# Patient Record
Sex: Male | Born: 1940 | Race: White | Hispanic: No | Marital: Married | Smoking: Former smoker
Health system: Southern US, Community
[De-identification: ages and names within clinical notes are randomized; demographics above are authoritative.]

## PROBLEM LIST (undated history)

## (undated) DIAGNOSIS — C679 Malignant neoplasm of bladder, unspecified: Secondary | ICD-10-CM

## (undated) DIAGNOSIS — E7800 Pure hypercholesterolemia, unspecified: Secondary | ICD-10-CM

## (undated) DIAGNOSIS — I10 Essential (primary) hypertension: Secondary | ICD-10-CM

## (undated) DIAGNOSIS — A4902 Methicillin resistant Staphylococcus aureus infection, unspecified site: Secondary | ICD-10-CM

## (undated) HISTORY — PX: BLADDER SURGERY: SHX569

## (undated) HISTORY — PX: BACK SURGERY: SHX140

---

## 2016-09-15 ENCOUNTER — Emergency Department (HOSPITAL_BASED_OUTPATIENT_CLINIC_OR_DEPARTMENT_OTHER)
Admission: EM | Admit: 2016-09-15 | Discharge: 2016-09-15 | Disposition: A | Discharge status: Home / Self Care | Payer: Medicare Other | Attending: Emergency Medicine | Admitting: Emergency Medicine

## 2016-09-15 ENCOUNTER — Encounter (HOSPITAL_BASED_OUTPATIENT_CLINIC_OR_DEPARTMENT_OTHER): Payer: Self-pay | Admitting: Emergency Medicine

## 2016-09-15 ENCOUNTER — Emergency Department (HOSPITAL_BASED_OUTPATIENT_CLINIC_OR_DEPARTMENT_OTHER): Payer: Medicare Other

## 2016-09-15 DIAGNOSIS — Z8551 Personal history of malignant neoplasm of bladder: Secondary | ICD-10-CM | POA: Diagnosis not present

## 2016-09-15 DIAGNOSIS — N1330 Unspecified hydronephrosis: Secondary | ICD-10-CM | POA: Diagnosis not present

## 2016-09-15 DIAGNOSIS — I10 Essential (primary) hypertension: Secondary | ICD-10-CM | POA: Insufficient documentation

## 2016-09-15 DIAGNOSIS — R1032 Left lower quadrant pain: Secondary | ICD-10-CM | POA: Diagnosis present

## 2016-09-15 HISTORY — DX: Pure hypercholesterolemia, unspecified: E78.00

## 2016-09-15 HISTORY — DX: Essential (primary) hypertension: I10

## 2016-09-15 HISTORY — DX: Malignant neoplasm of bladder, unspecified: C67.9

## 2016-09-15 LAB — CBC WITH DIFFERENTIAL/PLATELET
BASOS ABS: 0 10*3/uL (ref 0.0–0.1)
BASOS PCT: 0 %
Eosinophils Absolute: 0 10*3/uL (ref 0.0–0.7)
Eosinophils Relative: 0 %
HCT: 42.3 % (ref 39.0–52.0)
Hemoglobin: 14.4 g/dL (ref 13.0–17.0)
Lymphocytes Relative: 9 %
Lymphs Abs: 1 10*3/uL (ref 0.7–4.0)
MCH: 31.2 pg (ref 26.0–34.0)
MCHC: 34 g/dL (ref 30.0–36.0)
MCV: 91.6 fL (ref 78.0–100.0)
MONO ABS: 0.8 10*3/uL (ref 0.1–1.0)
Monocytes Relative: 8 %
Neutro Abs: 9.3 10*3/uL — ABNORMAL HIGH (ref 1.7–7.7)
Neutrophils Relative %: 83 %
PLATELETS: 200 10*3/uL (ref 150–400)
RBC: 4.62 MIL/uL (ref 4.22–5.81)
RDW: 12.6 % (ref 11.5–15.5)
WBC: 11.2 10*3/uL — ABNORMAL HIGH (ref 4.0–10.5)

## 2016-09-15 LAB — URINALYSIS, ROUTINE W REFLEX MICROSCOPIC
Bilirubin Urine: NEGATIVE
GLUCOSE, UA: NEGATIVE mg/dL
Ketones, ur: NEGATIVE mg/dL
Nitrite: NEGATIVE
PROTEIN: 100 mg/dL — AB
Specific Gravity, Urine: 1.015 (ref 1.005–1.030)
pH: 6 (ref 5.0–8.0)

## 2016-09-15 LAB — BASIC METABOLIC PANEL
Anion gap: 8 (ref 5–15)
BUN: 36 mg/dL — AB (ref 6–20)
CO2: 22 mmol/L (ref 22–32)
Calcium: 9.8 mg/dL (ref 8.9–10.3)
Chloride: 110 mmol/L (ref 101–111)
Creatinine, Ser: 1.97 mg/dL — ABNORMAL HIGH (ref 0.61–1.24)
GFR calc Af Amer: 36 mL/min — ABNORMAL LOW (ref >60)
GFR, EST NON AFRICAN AMERICAN: 31 mL/min — AB (ref >60)
GLUCOSE: 141 mg/dL — AB (ref 65–99)
Potassium: 4.2 mmol/L (ref 3.5–5.1)
Sodium: 140 mmol/L (ref 135–145)

## 2016-09-15 LAB — URINALYSIS, MICROSCOPIC (REFLEX)

## 2016-09-15 MED ORDER — CEPHALEXIN 500 MG PO CAPS: 500.0000 mg | ORAL_CAPSULE | Freq: Four times a day (QID) | ORAL | 0 refills | Status: DC

## 2016-09-15 MED ORDER — HYDROCODONE-ACETAMINOPHEN 5-325 MG PO TABS: 1.0000 | ORAL_TABLET | Freq: Four times a day (QID) | ORAL | 0 refills | Status: AC | PRN

## 2016-09-15 MED ORDER — ONDANSETRON HCL 4 MG/2ML IJ SOLN
4.0000 mg | Freq: Once | INTRAMUSCULAR | Status: AC
  Administered 2016-09-15: 4 mg via INTRAVENOUS

## 2016-09-15 MED ORDER — ONDANSETRON HCL 4 MG/2ML IJ SOLN
INTRAMUSCULAR | Status: AC
  Filled 2016-09-15: qty 2

## 2016-09-15 MED ORDER — KETOROLAC TROMETHAMINE 30 MG/ML IJ SOLN
15.0000 mg | Freq: Once | INTRAMUSCULAR | Status: AC
  Administered 2016-09-15: 15 mg via INTRAVENOUS
  Filled 2016-09-15: qty 1

## 2016-09-15 NOTE — ED Notes (Signed)
Patient transported to CT 

## 2016-09-15 NOTE — ED Notes (Signed)
ED Provider at bedside. 

## 2016-09-15 NOTE — ED Provider Notes (Signed)
[facility] DEPT [facility] Provider Note   CSN: [id number] Arrival date & time: 09/15/16  7169     History   Chief Complaint Chief Complaint  Patient presents with  . Back Pain    HPI Jerry Hunt is a 76 y.o. male.  Patient is a 76 year old male with history of hypertension, hypercholesterolemia, bladder cancer treated 20 years ago. He presents today for evaluation of left flank pain. This started approximately 3 AM and woke him from sleep. He denies any urinary or bowel complaints. He denies any radiation of his pain into his legs.   The history is provided by the patient.  Back Pain   This is a new problem. The current episode started 3 to 5 hours ago. The problem occurs constantly. The problem has not changed since onset.The pain is associated with no known injury. Pain location: Left flank. The quality of the pain is described as stabbing. The pain does not radiate. The pain is moderate. The symptoms are aggravated by bending, twisting and certain positions.    Past Medical History:  Diagnosis Date  . Bladder cancer ([place])   . High cholesterol   . Hypertension     There are no active problems to display for this patient.   Past Surgical History:  Procedure Laterality Date  . BACK SURGERY    . BLADDER SURGERY         Home Medications    Prior to Admission medications   Not on File    Family History No family history on file.  Social History Social History  Substance Use Topics  . Smoking status: Never Smoker  . Smokeless tobacco: Never Used  . Alcohol use No     Allergies   Patient has no known allergies.   Review of Systems Review of Systems  Musculoskeletal: Positive for back pain.  All other systems reviewed and are negative.    Physical Exam Updated Vital Signs BP (!) 171/92 (BP Location: Left Arm)   Pulse 89   Temp 98.3 F (36.8 C) (Oral)   Resp 18   Ht 5\' 9"  (1.753 m)   Wt 68 kg (150 lb)   SpO2 97%   BMI 22.15 kg/m    Physical Exam  Constitutional: He is oriented to person, place, and time. He appears well-developed and well-nourished. No distress.  HENT:  Head: Normocephalic and atraumatic.  Mouth/Throat: Oropharynx is clear and moist.  Neck: Normal range of motion. Neck supple.  Cardiovascular: Normal rate and regular rhythm.  Exam reveals no friction rub.   No murmur heard. Pulmonary/Chest: Effort normal and breath sounds normal. No respiratory distress. He has no wheezes. He has no rales.  Abdominal: Soft. Bowel sounds are normal. He exhibits no distension. There is no tenderness.  There is mild left-sided CVA tenderness.  Musculoskeletal: Normal range of motion. He exhibits no edema.  Neurological: He is alert and oriented to person, place, and time. Coordination normal.  Skin: Skin is warm and dry. He is not diaphoretic.  Nursing note and vitals reviewed.    ED Treatments / Results  Labs (all labs ordered are listed, but only abnormal results are displayed) Labs Reviewed  URINALYSIS, ROUTINE W REFLEX MICROSCOPIC  BASIC METABOLIC PANEL  CBC WITH DIFFERENTIAL/PLATELET    EKG  EKG Interpretation None       Radiology No results found.  Procedures Procedures (including critical care time)  Medications Ordered in ED Medications  ketorolac (TORADOL) 30 MG/ML injection 15 mg (not administered)  Initial Impression / Assessment and Plan / ED Course  I have reviewed the triage vital signs and the nursing notes.  Pertinent labs & imaging results that were available during my care of the patient were reviewed by me and considered in my medical decision making (see chart for details).  Patient presents with acute onset left flank pain. His presentation was consistent with a renal calculus area he was initially quite uncomfortable and was given a small dose of Toradol which did seem to help.  Workup reveals left-sided hydronephrosis that appears to be related to a ureteropelvic  junction narrowing. There is no evidence for renal calculus. His urine shows too numerous to count red cells and too numerous to count white cells, however no nitrates in his urine. His white count is 11,000 and there is no fever.  I've discussed this with Dr. McDiarmid from urology. It is his recommendation for the patient be treated with pain medicine and follow-up in the urology clinic in the next 2 weeks.  Final Clinical Impressions(s) / ED Diagnoses   Final diagnoses:  None    New Prescriptions New Prescriptions   No medications on file     Veryl Speak, MD 09/15/16 [phone]

## 2016-09-15 NOTE — ED Triage Notes (Signed)
Pt reports left sided lower back pain that awoke him from sleep. Pt report vomiting x 2 episodes. Pt denies any urinary symptoms.

## 2016-09-15 NOTE — ED Notes (Signed)
Patient denies pain and is resting comfortably.  

## 2016-09-15 NOTE — Discharge Instructions (Signed)
Keflex as prescribed.  Hydrocodone as prescribed as needed for pain.  Follow-up with urology in the next 2 weeks. The contact information for [facility] has been provided in this discharge summary for you to call and make these arrangements. Dr. McDiarmid is aware of your situation and once you seen in this timeframe.  Return to the emergency department if you develop worsening pain, high fevers, or other new and concerning symptoms.

## 2016-09-17 LAB — URINE CULTURE

## 2016-09-18 ENCOUNTER — Telehealth: Payer: Self-pay

## 2016-09-18 NOTE — Telephone Encounter (Signed)
UC faxed to Dr Venia Minks office [phone]

## 2019-01-24 ENCOUNTER — Other Ambulatory Visit: Payer: Self-pay

## 2019-01-24 DIAGNOSIS — Z20822 Contact with and (suspected) exposure to covid-19: Secondary | ICD-10-CM

## 2019-01-25 LAB — NOVEL CORONAVIRUS, NAA: SARS-CoV-2, NAA: NOT DETECTED

## 2019-02-04 ENCOUNTER — Telehealth: Payer: Self-pay | Admitting: Internal Medicine

## 2019-02-04 NOTE — Telephone Encounter (Signed)
Pt aware covid lab test negative, not detected °

## 2019-05-19 ENCOUNTER — Encounter (HOSPITAL_BASED_OUTPATIENT_CLINIC_OR_DEPARTMENT_OTHER): Payer: Self-pay | Admitting: Emergency Medicine

## 2019-05-19 ENCOUNTER — Other Ambulatory Visit: Payer: Self-pay

## 2019-05-19 ENCOUNTER — Emergency Department (HOSPITAL_BASED_OUTPATIENT_CLINIC_OR_DEPARTMENT_OTHER)
Admission: EM | Admit: 2019-05-19 | Discharge: 2019-05-19 | Disposition: A | Discharge status: Home / Self Care | Payer: Medicare Other | Attending: Emergency Medicine | Admitting: Emergency Medicine

## 2019-05-19 DIAGNOSIS — Z888 Allergy status to other drugs, medicaments and biological substances status: Secondary | ICD-10-CM | POA: Insufficient documentation

## 2019-05-19 DIAGNOSIS — Z79899 Other long term (current) drug therapy: Secondary | ICD-10-CM | POA: Insufficient documentation

## 2019-05-19 DIAGNOSIS — E782 Mixed hyperlipidemia: Secondary | ICD-10-CM | POA: Insufficient documentation

## 2019-05-19 DIAGNOSIS — Z91040 Latex allergy status: Secondary | ICD-10-CM | POA: Insufficient documentation

## 2019-05-19 DIAGNOSIS — Z881 Allergy status to other antibiotic agents status: Secondary | ICD-10-CM | POA: Diagnosis not present

## 2019-05-19 DIAGNOSIS — M791 Myalgia, unspecified site: Secondary | ICD-10-CM

## 2019-05-19 DIAGNOSIS — Z20822 Contact with and (suspected) exposure to covid-19: Secondary | ICD-10-CM | POA: Diagnosis not present

## 2019-05-19 DIAGNOSIS — M7918 Myalgia, other site: Secondary | ICD-10-CM | POA: Diagnosis present

## 2019-05-19 DIAGNOSIS — Z8551 Personal history of malignant neoplasm of bladder: Secondary | ICD-10-CM | POA: Insufficient documentation

## 2019-05-19 DIAGNOSIS — R6883 Chills (without fever): Secondary | ICD-10-CM | POA: Insufficient documentation

## 2019-05-19 DIAGNOSIS — I10 Essential (primary) hypertension: Secondary | ICD-10-CM | POA: Diagnosis not present

## 2019-05-19 DIAGNOSIS — Z886 Allergy status to analgesic agent status: Secondary | ICD-10-CM | POA: Insufficient documentation

## 2019-05-19 HISTORY — DX: Methicillin resistant Staphylococcus aureus infection, unspecified site: A49.02

## 2019-05-19 LAB — RAPID INFLUENZA A&B ANTIGENS
Influenza A (ARMC): NEGATIVE
Influenza B (ARMC): NEGATIVE

## 2019-05-19 NOTE — ED Triage Notes (Addendum)
Woke up with chills, aching and cough this am. Took 2 Tylenol at 1600 today

## 2019-05-19 NOTE — Discharge Instructions (Signed)
Please alternate 650 mg of Tylenol and 600 mg of ibuprofen every 4 hours as needed for any fever over 100.4.  If you should develop severe or worsening symptoms please return to the emergency department otherwise follow-up with your doctor in 1 week  If your test is positive for Covid or the flu you will be contacted.  Please quarantine until you get your results

## 2019-05-19 NOTE — ED Provider Notes (Signed)
[name] EMERGENCY DEPARTMENT Provider Note   CSN: [id number] Arrival date & time: 05/19/19  1654     History Chief Complaint  Patient presents with  . COVID symtoms    Jerry Hunt is a 79 y.o. male.  HPI   This patient is an 79 year old male, he has no significant prior chronic medical conditions however this morning he woke up with chills and body aches, has not measured a fever but has had ongoing chills and aches throughout the day.  He denies any other sources of infection, no cough or shortness of breath, no chest pain abdominal pain nausea vomiting or diarrhea.  No sore throat though he does have a mild runny nose.  He denies any sick contacts, he works at a funeral home, he has already been tested 4 times this year for coronavirus 19 and has been negative each time.  He received his first dose of vaccination approximately 10 days ago and is due for his next dose in 10 days.  The patient has no other symptoms, no sick contacts at home  Past Medical History:  Diagnosis Date  . Bladder cancer ([place])   . High cholesterol   . Hypertension   . MRSA (methicillin resistant Staphylococcus aureus)     There are no problems to display for this patient.   Past Surgical History:  Procedure Laterality Date  . BACK SURGERY    . BLADDER SURGERY         No family history on file.  Social History   Tobacco Use  . Smoking status: Never Smoker  . Smokeless tobacco: Never Used  Substance Use Topics  . Alcohol use: No  . Drug use: Not on file    Home Medications Prior to Admission medications   Medication Sig Start Date End Date Taking? Authorizing Provider  amLODipine (NORVASC) 5 MG tablet Take by mouth. 07/03/18  Yes [provider]  doxepin (SINEQUAN) 10 MG capsule TAKE 1 CAPSULE(10 MG) BY MOUTH DAILY 12/02/16  Yes [provider]  Omega-3 Fatty Acids (FISH OIL) 1000 MG CAPS TAKE 2 CAPSULES BY MOUTH TWICE DAILY 12/02/16  Yes [provider]  primidone (MYSOLINE) 50 MG tablet TAKE 1 TABLET(50 MG) BY MOUTH TWICE DAILY 12/02/16  Yes [provider]  cephALEXin (KEFLEX) 500 MG capsule Take 1 capsule (500 mg total) by mouth 4 (four) times daily. 09/15/16   Veryl Speak, MD  Cholecalciferol 25 MCG (1000 UT) tablet Take by mouth.    [provider]  cholestyramine light (PREVALITE) 4 GM/DOSE powder Take 4 g by mouth 2 (two) times daily. 05/13/19   [provider]  HYDROcodone-acetaminophen (NORCO/VICODIN) 5-325 MG tablet Take 1-2 tablets by mouth every 6 (six) hours as needed. 09/15/16   Veryl Speak, MD  metoprolol succinate (TOPROL-XL) 50 MG 24 hr tablet Take 50 mg by mouth daily. 05/13/19   [provider]  vitamin E 180 MG (400 UNITS) capsule Take by mouth.    [provider]    Allergies    Ace inhibitors, Aspirin, Atorvastatin, Ezetimibe, Olmesartan medoxomil-hctz, Other, Telithromycin, Lactase, and Latex  Review of Systems   Review of Systems  All other systems reviewed and are negative.   Physical Exam Updated Vital Signs BP (!) 161/91 (BP Location: Left Arm)   Pulse 91   Temp 97.8 F (36.6 C) (Oral)   Resp 20   Ht 1.753 m (5\' 9" )   Wt (!) 147.8 kg   SpO2 94%  BMI 48.12 kg/m   Physical Exam Vitals and nursing note reviewed.  Constitutional:      General: He is not in acute distress.    Appearance: He is well-developed.  HENT:     Head: Normocephalic and atraumatic.     Mouth/Throat:     Pharynx: No oropharyngeal exudate.  Eyes:     General: No scleral icterus.       Right eye: No discharge.        Left eye: No discharge.     Conjunctiva/sclera: Conjunctivae normal.     Pupils: Pupils are equal, round, and reactive to light.  Neck:     Thyroid: No thyromegaly.     Vascular: No JVD.  Cardiovascular:     Rate and Rhythm: Normal rate and regular rhythm.     Heart sounds: Normal heart sounds. No murmur. No friction rub. No gallop.   Pulmonary:      Effort: Pulmonary effort is normal. No respiratory distress.     Breath sounds: Normal breath sounds. No wheezing or rales.  Abdominal:     General: Bowel sounds are normal. There is no distension.     Palpations: Abdomen is soft. There is no mass.     Tenderness: There is no abdominal tenderness.  Musculoskeletal:        General: No tenderness. Normal range of motion.     Cervical back: Normal range of motion and neck supple.  Lymphadenopathy:     Cervical: No cervical adenopathy.  Skin:    General: Skin is warm and dry.     Findings: No erythema or rash.  Neurological:     Mental Status: He is alert.     Coordination: Coordination normal.  Psychiatric:        Behavior: Behavior normal.     ED Results / Procedures / Treatments   Labs (all labs ordered are listed, but only abnormal results are displayed) Labs Reviewed  SARS CORONAVIRUS 2 (TAT 6-24 HRS)  RAPID INFLUENZA A&B ANTIGENS    EKG None  Radiology No results found.  Procedures Procedures (including critical care time)  Medications Ordered in ED Medications - No data to display  ED Course  I have reviewed the triage vital signs and the nursing notes.  Pertinent labs & imaging results that were available during my care of the patient were reviewed by me and considered in my medical decision making (see chart for details).    MDM Rules/Calculators/A&P                      This patient is very well-appearing, he has a normal heart rate, normal oxygenation, no fever, no tachycardia, no hypotension.  He has no focal symptoms other than aches in his muscles very diffusely, chills but has a normal gait, normal speech pattern, normal respiratory status and lung function and on my exam appears very well.  He can have outpatient testing for both Covid and influenza and I anticipate discharge.  He does not have any other sources to make me think that this is intra-abdominal or more severe infection that would require  further evaluation or inpatient treatment.  He is agreeable to the plan.   Final Clinical Impression(s) / ED Diagnoses Final diagnoses:  Chills  Myalgia    Rx / DC Orders ED Discharge Orders    None       Noemi Chapel, MD 05/19/19 1755

## 2019-05-20 LAB — SARS CORONAVIRUS 2 (TAT 6-24 HRS): SARS Coronavirus 2: NEGATIVE

## 2019-05-21 ENCOUNTER — Telehealth (HOSPITAL_COMMUNITY): Payer: Self-pay

## 2020-06-16 ENCOUNTER — Other Ambulatory Visit: Payer: Self-pay

## 2020-06-16 ENCOUNTER — Encounter (HOSPITAL_BASED_OUTPATIENT_CLINIC_OR_DEPARTMENT_OTHER): Payer: Self-pay | Admitting: Emergency Medicine

## 2020-06-16 ENCOUNTER — Emergency Department (HOSPITAL_BASED_OUTPATIENT_CLINIC_OR_DEPARTMENT_OTHER)
Admission: EM | Admit: 2020-06-16 | Discharge: 2020-06-16 | Disposition: A | Discharge status: Home / Self Care | Payer: Medicare Other | Attending: Emergency Medicine | Admitting: Emergency Medicine

## 2020-06-16 ENCOUNTER — Emergency Department (HOSPITAL_BASED_OUTPATIENT_CLINIC_OR_DEPARTMENT_OTHER): Payer: Medicare Other

## 2020-06-16 DIAGNOSIS — R0981 Nasal congestion: Secondary | ICD-10-CM | POA: Diagnosis present

## 2020-06-16 DIAGNOSIS — I10 Essential (primary) hypertension: Secondary | ICD-10-CM | POA: Insufficient documentation

## 2020-06-16 DIAGNOSIS — Z8551 Personal history of malignant neoplasm of bladder: Secondary | ICD-10-CM | POA: Diagnosis not present

## 2020-06-16 DIAGNOSIS — Z91040 Latex allergy status: Secondary | ICD-10-CM | POA: Insufficient documentation

## 2020-06-16 DIAGNOSIS — R911 Solitary pulmonary nodule: Secondary | ICD-10-CM | POA: Diagnosis not present

## 2020-06-16 DIAGNOSIS — N3001 Acute cystitis with hematuria: Secondary | ICD-10-CM | POA: Insufficient documentation

## 2020-06-16 DIAGNOSIS — Z20822 Contact with and (suspected) exposure to covid-19: Secondary | ICD-10-CM | POA: Insufficient documentation

## 2020-06-16 LAB — URINALYSIS, MICROSCOPIC (REFLEX): WBC, UA: >50 WBC/hpf (ref 0–5)

## 2020-06-16 LAB — COMPREHENSIVE METABOLIC PANEL
ALT: 15 U/L (ref 0–44)
AST: 16 U/L (ref 15–41)
Albumin: 3.1 g/dL — ABNORMAL LOW (ref 3.5–5.0)
Alkaline Phosphatase: 73 U/L (ref 38–126)
Anion gap: 9 (ref 5–15)
BUN: 30 mg/dL — ABNORMAL HIGH (ref 8–23)
CO2: 19 mmol/L — ABNORMAL LOW (ref 22–32)
Calcium: 8.9 mg/dL (ref 8.9–10.3)
Chloride: 104 mmol/L (ref 98–111)
Creatinine, Ser: 2.05 mg/dL — ABNORMAL HIGH (ref 0.61–1.24)
GFR, Estimated: 32 mL/min — ABNORMAL LOW (ref >60)
Glucose, Bld: 124 mg/dL — ABNORMAL HIGH (ref 70–99)
Potassium: 3.7 mmol/L (ref 3.5–5.1)
Sodium: 132 mmol/L — ABNORMAL LOW (ref 135–145)
Total Bilirubin: 0.3 mg/dL (ref 0.3–1.2)
Total Protein: 7.4 g/dL (ref 6.5–8.1)

## 2020-06-16 LAB — URINALYSIS, ROUTINE W REFLEX MICROSCOPIC
Bilirubin Urine: NEGATIVE
Glucose, UA: NEGATIVE mg/dL
Ketones, ur: NEGATIVE mg/dL
Nitrite: NEGATIVE
Protein, ur: 30 mg/dL — AB
Specific Gravity, Urine: 1.015 (ref 1.005–1.030)
pH: 6 (ref 5.0–8.0)

## 2020-06-16 LAB — CBC WITH DIFFERENTIAL/PLATELET
Abs Immature Granulocytes: 0.04 10*3/uL (ref 0.00–0.07)
Basophils Absolute: 0 10*3/uL (ref 0.0–0.1)
Basophils Relative: 0 %
Eosinophils Absolute: 0 10*3/uL (ref 0.0–0.5)
Eosinophils Relative: 0 %
HCT: 40.6 % (ref 39.0–52.0)
Hemoglobin: 13.9 g/dL (ref 13.0–17.0)
Immature Granulocytes: 0 %
Lymphocytes Relative: 10 %
Lymphs Abs: 1.2 10*3/uL (ref 0.7–4.0)
MCH: 31.4 pg (ref 26.0–34.0)
MCHC: 34.2 g/dL (ref 30.0–36.0)
MCV: 91.6 fL (ref 80.0–100.0)
Monocytes Absolute: 1.7 10*3/uL — ABNORMAL HIGH (ref 0.1–1.0)
Monocytes Relative: 14 %
Neutro Abs: 9.2 10*3/uL — ABNORMAL HIGH (ref 1.7–7.7)
Neutrophils Relative %: 76 %
Platelets: 166 10*3/uL (ref 150–400)
RBC: 4.43 MIL/uL (ref 4.22–5.81)
RDW: 13.2 % (ref 11.5–15.5)
WBC: 12.2 10*3/uL — ABNORMAL HIGH (ref 4.0–10.5)
nRBC: 0 % (ref 0.0–0.2)

## 2020-06-16 LAB — LACTIC ACID, PLASMA: Lactic Acid, Venous: 0.8 mmol/L (ref 0.5–1.9)

## 2020-06-16 MED ORDER — CEPHALEXIN 500 MG PO CAPS: 500.0000 mg | ORAL_CAPSULE | Freq: Four times a day (QID) | ORAL | 0 refills | Status: AC

## 2020-06-16 MED ORDER — SODIUM CHLORIDE 0.9 % IV BOLUS
500.0000 mL | Freq: Once | INTRAVENOUS | Status: AC
  Administered 2020-06-16: 500 mL via INTRAVENOUS

## 2020-06-16 MED ORDER — SODIUM CHLORIDE 0.9 % IV SOLN
1.0000 g | Freq: Once | INTRAVENOUS | Status: AC
  Administered 2020-06-16: 1 g via INTRAVENOUS
  Filled 2020-06-16: qty 10

## 2020-06-16 MED ORDER — ACETAMINOPHEN 500 MG PO TABS
1000.0000 mg | ORAL_TABLET | Freq: Once | ORAL | Status: AC
  Administered 2020-06-16: 1000 mg via ORAL
  Filled 2020-06-16: qty 2

## 2020-06-16 NOTE — ED Triage Notes (Signed)
C/o chills, body aches, and runny nose.  States my wife thinks I have covid.

## 2020-06-16 NOTE — Discharge Instructions (Addendum)
You were seen in the emerge department today with body aches and chills.  It appears as if you have a urine infection and I am starting on antibiotics.I am testing you for Covid as well and the test results will come through in the [facility] app on your phone.  Please follow closely with your primary care doctor and if you begin to feel worse despite antibiotics please return for reevaluation.

## 2020-06-16 NOTE — ED Provider Notes (Signed)
Emergency Department Provider Note   I have reviewed the triage vital signs and the nursing notes.   HISTORY  Chief Complaint covid symptoms   HPI Jerry Hunt is a 80 y.o. male with PMH reviewed below presents to the ED with body aches and nasal congestion with concern for COVID re-infection. He denies any SOB or CP. He developed the above symptoms and ultimately was convinced by his wife to come in for ED evaluation. No dysuria, hesitancy, or urgency. No abdominal pain. No back or flank pain. No radiation of symptoms or modifying factors.    Past Medical History:  Diagnosis Date  . Bladder cancer ([place])   . High cholesterol   . Hypertension   . MRSA (methicillin resistant Staphylococcus aureus)     There are no problems to display for this patient.   Past Surgical History:  Procedure Laterality Date  . BACK SURGERY    . BLADDER SURGERY      Allergies Ace inhibitors, Aspirin, Atorvastatin, Ezetimibe, Olmesartan medoxomil-hctz, Other, Telithromycin, Lactase, and Latex  No family history on file.  Social History Social History   Tobacco Use  . Smoking status: Never Smoker  . Smokeless tobacco: Never Used  Substance Use Topics  . Alcohol use: No    Review of Systems  Constitutional: Positive fever/chills Eyes: No visual changes. ENT: No sore throat. Cardiovascular: Denies chest pain. Respiratory: Denies shortness of breath. Gastrointestinal: No abdominal pain.  No nausea, no vomiting.  No diarrhea.  No constipation. Genitourinary: Negative for dysuria. Musculoskeletal: Negative for back pain. Skin: Negative for rash. Neurological: Negative for headaches, focal weakness or numbness.  10-point ROS otherwise negative.  ____________________________________________   PHYSICAL EXAM:  VITAL SIGNS: ED Triage Vitals  Enc Vitals Group     BP 06/16/20 1955 (!) 128/98     Pulse Rate 06/16/20 1955 (!) 125     Resp 06/16/20 1955 20     Temp 06/16/20 1955  98.6 F (37 C)     Temp Source 06/16/20 1955 Oral     SpO2 06/16/20 1955 94 %     Weight 06/16/20 1956 155 lb (70.3 kg)     Height 06/16/20 1956 5\' 9"  (1.753 m)   Constitutional: Alert and oriented. Well appearing and in no acute distress. Eyes: Conjunctivae are normal.  Head: Atraumatic. Nose: No congestion/rhinnorhea. Mouth/Throat: Mucous membranes are moist.  Oropharynx non-erythematous. Neck: No stridor.   Cardiovascular: Normal rate, regular rhythm. Good peripheral circulation. Grossly normal heart sounds.   Respiratory: Normal respiratory effort.  No retractions. Lungs CTAB. Gastrointestinal: Soft and nontender. No distention.  Musculoskeletal: No lower extremity tenderness nor edema. No gross deformities of extremities. Neurologic:  Normal speech and language. No gross focal neurologic deficits are appreciated.  Skin:  Skin is warm, dry and intact. No rash noted.  ____________________________________________   LABS (all labs ordered are listed, but only abnormal results are displayed)  Labs Reviewed  URINE CULTURE - Abnormal; Notable for the following components:      Result Value   Culture   (*)    Value: >=100,000 COLONIES/mL METHICILLIN RESISTANT STAPHYLOCOCCUS AUREUS   Organism ID, Bacteria METHICILLIN RESISTANT STAPHYLOCOCCUS AUREUS (*)    All other components within normal limits  COMPREHENSIVE METABOLIC PANEL - Abnormal; Notable for the following components:   Sodium 132 (*)    CO2 19 (*)    Glucose, Bld 124 (*)    BUN 30 (*)    Creatinine, Ser 2.05 (*)    Albumin 3.1 (*)  GFR, Estimated 32 (*)    All other components within normal limits  CBC WITH DIFFERENTIAL/PLATELET - Abnormal; Notable for the following components:   WBC 12.2 (*)    Neutro Abs 9.2 (*)    Monocytes Absolute 1.7 (*)    All other components within normal limits  URINALYSIS, ROUTINE W REFLEX MICROSCOPIC - Abnormal; Notable for the following components:   APPearance CLOUDY (*)    Hgb  urine dipstick LARGE (*)    Protein, ur 30 (*)    Leukocytes,Ua LARGE (*)    All other components within normal limits  URINALYSIS, MICROSCOPIC (REFLEX) - Abnormal; Notable for the following components:   Bacteria, UA MANY (*)    All other components within normal limits  CULTURE, BLOOD (ROUTINE X 2)  CULTURE, BLOOD (ROUTINE X 2)  SARS CORONAVIRUS 2 (TAT 6-24 HRS)  LACTIC ACID, PLASMA   ____________________________________________  EKG   EKG Interpretation  Date/Time:  Tuesday June 16 2020 20:25:20 EST Ventricular Rate:  111 PR Interval:    QRS Duration: 133 QT Interval:  336 QTC Calculation: 457 R Axis:   87 Text Interpretation: Sinus tachycardia Prominent P waves, nondiagnostic Right bundle branch block ST elevation, consider inferior injury similar to previous tracing on the same day Confirmed by Theotis Burrow [phone]) on 06/17/2020 2:55:33 PM       ____________________________________________  RADIOLOGY  No acute findings on CXR.  ____________________________________________   PROCEDURES  Procedure(s) performed:   Procedures  None ____________________________________________   INITIAL IMPRESSION / ASSESSMENT AND PLAN / ED COURSE  Pertinent labs & imaging results that were available during my care of the patient were reviewed by me and considered in my medical decision making (see chart for details).   Patient presents to the ED with fever and chills. COVID PCR is negative. Patient arrives with tachycardia but improved with IVF. Mild leukocytosis with normal lactate. No sign of end-organ damage. Doubt sepsis. Patient with UA looking concerning for UTI. Plan for Rocephin here and will discharge home with PO abx. Will send UA for culture. Blood culture sent as well.   Patient looking well and feeling much better at the time of discharge.  ____________________________________________  FINAL CLINICAL IMPRESSION(S) / ED DIAGNOSES  Final diagnoses:  Acute  cystitis with hematuria  Incidental lung nodule     MEDICATIONS GIVEN DURING THIS VISIT:  Medications  acetaminophen (TYLENOL) tablet 1,000 mg (1,000 mg Oral Given 06/16/20 2050)  sodium chloride 0.9 % bolus 500 mL (0 mLs Intravenous Stopped 06/16/20 2225)  cefTRIAXone (ROCEPHIN) 1 g in sodium chloride 0.9 % 100 mL IVPB (0 g Intravenous Stopped 06/16/20 2322)     Note:  This document was prepared using Dragon voice recognition software and may include unintentional dictation errors.  Nanda Quinton, MD, [facility] Emergency Medicine    Sayvon Arterberry, W[facility], MD 06/25/20 [phone]

## 2020-06-17 LAB — SARS CORONAVIRUS 2 (TAT 6-24 HRS): SARS Coronavirus 2: NEGATIVE

## 2020-06-19 LAB — URINE CULTURE: Culture: >=100000 — AB

## 2020-06-20 NOTE — Progress Notes (Signed)
ED Antimicrobial Stewardship Positive Culture Follow Up   Jerry Hunt is an 80 y.o. male who presented to [facility] on 06/16/2020 with a chief complaint of chills, body aches and runny nose. UA concerning for UTI.  Chief Complaint  Patient presents with  . covid symptoms    Recent Results (from the past 720 hour(s))  Urine culture     Status: Abnormal   Collection Time: 06/16/20  8:46 PM   Specimen: Urine, Clean Catch  Result Value Ref Range Status   Specimen Description   Final    URINE, CLEAN CATCH Performed at [facility], [address] [place], [place] [address]    Special Requests   Final    NONE Performed at [facility], [address] [place], [place] [address]    Culture (A)  Final    >=100,000 COLONIES/mL METHICILLIN RESISTANT STAPHYLOCOCCUS AUREUS   Report Status 06/19/2020 FINAL  Final   Organism ID, Bacteria METHICILLIN RESISTANT STAPHYLOCOCCUS AUREUS (A)  Final      Susceptibility   Methicillin resistant staphylococcus aureus - MIC*    CIPROFLOXACIN >=8 RESISTANT Resistant     GENTAMICIN <=0.5 SENSITIVE Sensitive     NITROFURANTOIN <=16 SENSITIVE Sensitive     OXACILLIN >=4 RESISTANT Resistant     TETRACYCLINE <=1 SENSITIVE Sensitive     VANCOMYCIN <=0.5 SENSITIVE Sensitive     TRIMETH/SULFA <=10 SENSITIVE Sensitive     CLINDAMYCIN >=8 RESISTANT Resistant     RIFAMPIN <=0.5 SENSITIVE Sensitive     Inducible Clindamycin NEGATIVE Sensitive     * >=100,000 COLONIES/mL METHICILLIN RESISTANT STAPHYLOCOCCUS AUREUS  Culture, blood (routine x 2)     Status: None (Preliminary result)   Collection Time: 06/16/20  9:55 PM   Specimen: BLOOD  Result Value Ref Range Status   Specimen Description   Final    BLOOD RIGHT ANTECUBITAL Performed at [facility], [address] [place], [place] [address]    Special Requests   Final    BOTTLES DRAWN AEROBIC AND ANAEROBIC Blood Culture adequate volume Performed at [facility],  [address] [place], [place] [address]    Culture   Final    NO GROWTH 3 DAYS Performed at [facility], [address] [place], [place] [address]    Report Status PENDING  Incomplete  Culture, blood (routine x 2)     Status: None (Preliminary result)   Collection Time: 06/16/20 10:05 PM   Specimen: BLOOD LEFT FOREARM  Result Value Ref Range Status   Specimen Description   Final    BLOOD LEFT FOREARM Performed at [facility], [address] [place], [place] [address]    Special Requests   Final    BOTTLES DRAWN AEROBIC AND ANAEROBIC Blood Culture results may not be optimal due to an inadequate volume of blood received in culture bottles Performed at [facility], [address] [place], [place] [address]    Culture   Final    NO GROWTH 3 DAYS Performed at [facility], [place] [address], [place],  [address]    Report Status PENDING  Incomplete  SARS CORONAVIRUS 2 (TAT 6-24 HRS) Nasopharyngeal Nasopharyngeal Swab     Status: None   Collection Time: 06/16/20 11:24 PM   Specimen: Nasopharyngeal Swab  Result Value Ref Range Status   SARS Coronavirus 2 NEGATIVE NEGATIVE Final    Comment: (NOTE)  SARS-CoV-2 target nucleic acids are NOT DETECTED.  The SARS-CoV-2 RNA is generally detectable in upper and lower respiratory specimens during the acute phase of infection. Negative results do not preclude SARS-CoV-2 infection, do not rule out co-infections with other pathogens, and should not be used as the sole basis for treatment or other patient management decisions. Negative results must be combined with clinical observations, patient history, and epidemiological information. The expected result is Negative.  Fact Sheet for Patients: SugarRoll.be  Fact Sheet for Healthcare Providers: https://www.woods-mathews.com/  This test is not yet approved or cleared by the [place] FDA and  has been  authorized for detection and/or diagnosis of SARS-CoV-2 by FDA under an Emergency Use Authorization (EUA). This EUA will remain  in effect (meaning this test can be used) for the duration of the COVID-19 declaration under Se ction 564(b)(1) of the Act, 21 U.S.C. section 360bbb-3(b)(1), unless the authorization is terminated or revoked sooner.  Performed at [facility], [place] [address] [place], [place] [address]     [x]  Treated with cephalexin, organism resistant to prescribed antimicrobial []  Patient discharged originally without antimicrobial agent and treatment is now indicated  New antibiotic prescription: Doxycycline 100 mg twice daly x 7 days   ED Provider: Dwaine Deter, PA-C    Albertina Parr, PharmD., BCPS, BCCCP Clinical Pharmacist Please refer to [facility] for unit-specific pharmacist

## 2020-06-22 LAB — CULTURE, BLOOD (ROUTINE X 2)
Culture: NO GROWTH
Culture: NO GROWTH
Special Requests: ADEQUATE

## 2021-11-24 ENCOUNTER — Other Ambulatory Visit: Payer: Self-pay

## 2021-11-24 ENCOUNTER — Emergency Department (HOSPITAL_BASED_OUTPATIENT_CLINIC_OR_DEPARTMENT_OTHER): Payer: Medicare Other

## 2021-11-24 ENCOUNTER — Encounter (HOSPITAL_BASED_OUTPATIENT_CLINIC_OR_DEPARTMENT_OTHER): Payer: Self-pay | Admitting: Emergency Medicine

## 2021-11-24 ENCOUNTER — Emergency Department (HOSPITAL_BASED_OUTPATIENT_CLINIC_OR_DEPARTMENT_OTHER)
Admission: EM | Admit: 2021-11-24 | Discharge: 2021-11-24 | Disposition: A | Discharge status: Home / Self Care | Payer: Medicare Other | Attending: Emergency Medicine | Admitting: Emergency Medicine

## 2021-11-24 DIAGNOSIS — J449 Chronic obstructive pulmonary disease, unspecified: Secondary | ICD-10-CM | POA: Insufficient documentation

## 2021-11-24 DIAGNOSIS — I10 Essential (primary) hypertension: Secondary | ICD-10-CM | POA: Diagnosis not present

## 2021-11-24 DIAGNOSIS — R0602 Shortness of breath: Secondary | ICD-10-CM | POA: Insufficient documentation

## 2021-11-24 DIAGNOSIS — J40 Bronchitis, not specified as acute or chronic: Secondary | ICD-10-CM | POA: Insufficient documentation

## 2021-11-24 DIAGNOSIS — Z20822 Contact with and (suspected) exposure to covid-19: Secondary | ICD-10-CM | POA: Diagnosis not present

## 2021-11-24 DIAGNOSIS — Z8551 Personal history of malignant neoplasm of bladder: Secondary | ICD-10-CM | POA: Insufficient documentation

## 2021-11-24 DIAGNOSIS — R059 Cough, unspecified: Secondary | ICD-10-CM | POA: Diagnosis present

## 2021-11-24 DIAGNOSIS — Z91040 Latex allergy status: Secondary | ICD-10-CM | POA: Diagnosis not present

## 2021-11-24 DIAGNOSIS — Z79899 Other long term (current) drug therapy: Secondary | ICD-10-CM | POA: Diagnosis not present

## 2021-11-24 LAB — URINALYSIS, ROUTINE W REFLEX MICROSCOPIC
Bilirubin Urine: NEGATIVE
Glucose, UA: NEGATIVE mg/dL
Ketones, ur: NEGATIVE mg/dL
Leukocytes,Ua: NEGATIVE
Nitrite: NEGATIVE
Protein, ur: 100 mg/dL — AB
Specific Gravity, Urine: 1.015 (ref 1.005–1.030)
pH: 5.5 (ref 5.0–8.0)

## 2021-11-24 LAB — CBC WITH DIFFERENTIAL/PLATELET
Abs Immature Granulocytes: 0.11 10*3/uL — ABNORMAL HIGH (ref 0.00–0.07)
Basophils Absolute: 0 10*3/uL (ref 0.0–0.1)
Basophils Relative: 0 %
Eosinophils Absolute: 0 10*3/uL (ref 0.0–0.5)
Eosinophils Relative: 0 %
HCT: 42.4 % (ref 39.0–52.0)
Hemoglobin: 14.4 g/dL (ref 13.0–17.0)
Immature Granulocytes: 1 %
Lymphocytes Relative: 6 %
Lymphs Abs: 1 10*3/uL (ref 0.7–4.0)
MCH: 31.4 pg (ref 26.0–34.0)
MCHC: 34 g/dL (ref 30.0–36.0)
MCV: 92.4 fL (ref 80.0–100.0)
Monocytes Absolute: 1.3 10*3/uL — ABNORMAL HIGH (ref 0.1–1.0)
Monocytes Relative: 8 %
Neutro Abs: 13.4 10*3/uL — ABNORMAL HIGH (ref 1.7–7.7)
Neutrophils Relative %: 85 %
Platelets: 181 10*3/uL (ref 150–400)
RBC: 4.59 MIL/uL (ref 4.22–5.81)
RDW: 12.9 % (ref 11.5–15.5)
WBC: 15.9 10*3/uL — ABNORMAL HIGH (ref 4.0–10.5)
nRBC: 0 % (ref 0.0–0.2)

## 2021-11-24 LAB — TROPONIN I (HIGH SENSITIVITY)
Troponin I (High Sensitivity): 7 ng/L (ref <18)
Troponin I (High Sensitivity): 8 ng/L (ref <18)

## 2021-11-24 LAB — BASIC METABOLIC PANEL
Anion gap: 8 (ref 5–15)
BUN: 27 mg/dL — ABNORMAL HIGH (ref 8–23)
CO2: 22 mmol/L (ref 22–32)
Calcium: 9.5 mg/dL (ref 8.9–10.3)
Chloride: 106 mmol/L (ref 98–111)
Creatinine, Ser: 1.84 mg/dL — ABNORMAL HIGH (ref 0.61–1.24)
GFR, Estimated: 36 mL/min — ABNORMAL LOW (ref >60)
Glucose, Bld: 118 mg/dL — ABNORMAL HIGH (ref 70–99)
Potassium: 4.1 mmol/L (ref 3.5–5.1)
Sodium: 136 mmol/L (ref 135–145)

## 2021-11-24 LAB — SARS CORONAVIRUS 2 BY RT PCR: SARS Coronavirus 2 by RT PCR: NEGATIVE

## 2021-11-24 LAB — URINALYSIS, MICROSCOPIC (REFLEX): WBC, UA: NONE SEEN WBC/hpf (ref 0–5)

## 2021-11-24 LAB — D-DIMER, QUANTITATIVE: D-Dimer, Quant: 0.43 ug/mL-FEU (ref 0.00–0.50)

## 2021-11-24 LAB — BRAIN NATRIURETIC PEPTIDE: B Natriuretic Peptide: 40.9 pg/mL (ref 0.0–100.0)

## 2021-11-24 MED ORDER — SODIUM CHLORIDE 0.9 % IV BOLUS
1000.0000 mL | Freq: Once | INTRAVENOUS | Status: AC
  Administered 2021-11-24: 1000 mL via INTRAVENOUS

## 2021-11-24 MED ORDER — ALBUTEROL SULFATE HFA 108 (90 BASE) MCG/ACT IN AERS: 1.0000 | INHALATION_SPRAY | Freq: Four times a day (QID) | RESPIRATORY_TRACT | 0 refills | Status: AC | PRN

## 2021-11-24 MED ORDER — DOXYCYCLINE HYCLATE 100 MG PO CAPS: 100.0000 mg | ORAL_CAPSULE | Freq: Two times a day (BID) | ORAL | 0 refills | Status: AC

## 2021-11-24 MED ORDER — METOPROLOL SUCCINATE ER 25 MG PO TB24
50.0000 mg | ORAL_TABLET | Freq: Every day | ORAL | Status: DC
  Administered 2021-11-24: 50 mg via ORAL
  Filled 2021-11-24: qty 2

## 2021-11-24 MED ORDER — BENZONATATE 100 MG PO CAPS: 100.0000 mg | ORAL_CAPSULE | Freq: Three times a day (TID) | ORAL | 0 refills | Status: AC

## 2021-11-24 NOTE — ED Notes (Signed)
D dimer redrawned

## 2021-11-24 NOTE — ED Notes (Signed)
Pt is aware he needs a urine sample. Pt has a urinal at bedside

## 2021-11-24 NOTE — ED Triage Notes (Signed)
Patient reports chills all day today, body aches, cough, and runny nose.

## 2021-11-24 NOTE — Discharge Instructions (Signed)
Take antibiotics as prescribed.  Use your inhaler as needed every 4-6 hours.  Follow-up with your primary doctor.  Return to the ED with chest pain, shortness of breath, not able to eat or drink or any other concerns

## 2021-11-24 NOTE — ED Provider Notes (Signed)
[facility] EMERGENCY DEPARTMENT Provider Note   CSN: [id number] Arrival date & time: 11/24/21  1738     History  Chief Complaint  Patient presents with   Cough    Jerry Hunt is a 81 y.o. male.  Patient with a history of hypertension, bladder cancer, COPD presenting with a 1 day history of chills, body aches, cough, runny nose.  He is concerned he could have COVID again.  No travel or sick contacts.  Reports feeling chills and achy today with a cough productive of green and yellow mucus as well as runny nose.  No significant headache or sore throat.  No chest pain or shortness of breath.  No abdominal pain, nausea or vomiting.  No leg pain or leg swelling.  No pain with urination or blood in the urine.  No travel or sick contacts.  Using albuterol at home without relief.  The history is provided by the patient.  Cough Associated symptoms: myalgias and rhinorrhea   Associated symptoms: no chest pain and no shortness of breath        Home Medications Prior to Admission medications   Medication Sig Start Date End Date Taking? Authorizing Provider  amLODipine (NORVASC) 5 MG tablet Take by mouth. 07/03/18   [provider]  Cholecalciferol 25 MCG (1000 UT) tablet Take by mouth.    [provider]  cholestyramine light (PREVALITE) 4 GM/DOSE powder Take 4 g by mouth 2 (two) times daily. 05/13/19   [provider]  doxepin (SINEQUAN) 10 MG capsule TAKE 1 CAPSULE(10 MG) BY MOUTH DAILY 12/02/16   [provider]  HYDROcodone-acetaminophen (NORCO/VICODIN) 5-325 MG tablet Take 1-2 tablets by mouth every 6 (six) hours as needed. 09/15/16   Veryl Speak, MD  metoprolol succinate (TOPROL-XL) 50 MG 24 hr tablet Take 50 mg by mouth daily. 05/13/19   [provider]  Omega-3 Fatty Acids (FISH OIL) 1000 MG CAPS TAKE 2 CAPSULES BY MOUTH TWICE DAILY 12/02/16   [provider]  primidone (MYSOLINE) 50 MG tablet TAKE 1 TABLET(50 MG) BY MOUTH  TWICE DAILY 12/02/16   [provider]  vitamin E 180 MG (400 UNITS) capsule Take by mouth.    [provider]      Allergies    Ace inhibitors, Aspirin, Atorvastatin, Ezetimibe, Olmesartan medoxomil-hctz, Other, Telithromycin, Latex, and Tilactase    Review of Systems   Review of Systems  HENT:  Positive for congestion and rhinorrhea.   Respiratory:  Positive for cough. Negative for shortness of breath.   Cardiovascular:  Negative for chest pain.  Gastrointestinal:  Negative for abdominal pain, nausea and vomiting.  Genitourinary:  Negative for dysuria and hematuria.  Musculoskeletal:  Positive for arthralgias and myalgias.  Neurological:  Positive for weakness.   all other systems are negative except as noted in the HPI and PMH.    Physical Exam Updated Vital Signs BP (!) 156/89 (BP Location: Left Arm)   Pulse (!) 115   Temp 98.4 F (36.9 C) (Oral)   Resp 20   Ht '5\' 9"'$  (1.753 m)   Wt 68 kg   SpO2 94%   BMI 22.15 kg/m  Physical Exam Vitals and nursing note reviewed.  Constitutional:      General: He is not in acute distress.    Appearance: He is well-developed.     Comments: No distress, speaking full sentences  HENT:     Head: Normocephalic and atraumatic.     Mouth/Throat:     Pharynx:  No oropharyngeal exudate.  Eyes:     Conjunctiva/sclera: Conjunctivae normal.     Pupils: Pupils are equal, round, and reactive to light.  Neck:     Comments: No meningismus. Cardiovascular:     Rate and Rhythm: Regular rhythm. Tachycardia present.     Heart sounds: Normal heart sounds. No murmur heard. Pulmonary:     Effort: Pulmonary effort is normal. No respiratory distress.     Breath sounds: Normal breath sounds.  Chest:     Chest wall: No tenderness.  Abdominal:     Palpations: Abdomen is soft.     Tenderness: There is no abdominal tenderness. There is no guarding or rebound.  Musculoskeletal:        General: No tenderness. Normal range of motion.      Cervical back: Normal range of motion and neck supple.  Skin:    General: Skin is warm.  Neurological:     Mental Status: He is alert and oriented to person, place, and time.     Cranial Nerves: No cranial nerve deficit.     Motor: No abnormal muscle tone.     Coordination: Coordination normal.     Comments: No ataxia on finger to nose bilaterally. No pronator drift. 5/5 strength throughout. CN 2-12 intact.Equal grip strength. Sensation intact.  Resting upper extremity tremor  Psychiatric:        Behavior: Behavior normal.     ED Results / Procedures / Treatments   Labs (all labs ordered are listed, but only abnormal results are displayed) Labs Reviewed  CBC WITH DIFFERENTIAL/PLATELET - Abnormal; Notable for the following components:      Result Value   WBC 15.9 (*)    Neutro Abs 13.4 (*)    Monocytes Absolute 1.3 (*)    Abs Immature Granulocytes 0.11 (*)    All other components within normal limits  BASIC METABOLIC PANEL - Abnormal; Notable for the following components:   Glucose, Bld 118 (*)    BUN 27 (*)    Creatinine, Ser 1.84 (*)    GFR, Estimated 36 (*)    All other components within normal limits  URINALYSIS, ROUTINE W REFLEX MICROSCOPIC - Abnormal; Notable for the following components:   Hgb urine dipstick TRACE (*)    Protein, ur 100 (*)    All other components within normal limits  URINALYSIS, MICROSCOPIC (REFLEX) - Abnormal; Notable for the following components:   Bacteria, UA RARE (*)    All other components within normal limits  SARS CORONAVIRUS 2 BY RT PCR  BRAIN NATRIURETIC PEPTIDE  D-DIMER, QUANTITATIVE  TROPONIN I (HIGH SENSITIVITY)  TROPONIN I (HIGH SENSITIVITY)    EKG EKG Interpretation  Date/Time:  Wednesday November 24 2021 20:09:56 EDT Ventricular Rate:  114 PR Interval:  175 QRS Duration: 133 QT Interval:  336 QTC Calculation: 463 R Axis:   79 Text Interpretation: Sinus tachycardia Right bundle branch block Minimal ST elevation, lateral  leads No significant change was found Confirmed by Ezequiel Essex [phone]) on 11/24/2021 8:22:46 PM  Radiology DG Chest 2 View  Result Date: 11/24/2021 CLINICAL DATA:  Cough, fever EXAM: CHEST - 2 VIEW COMPARISON:  06/17/2018 FINDINGS: Cardiac size is within normal limits. Increase in AP diameter of chest suggests COPD. There are small linear densities in both parahilar regions and left lower lung field suggesting scarring. No new focal pulmonary infiltrates are seen. There is no pleural effusion or pneumothorax. IMPRESSION: COPD.  There are no new infiltrates or signs of pulmonary edema.  Electronically Signed   By: Elmer Picker M.D.   On: 11/24/2021 18:35    Procedures Procedures    Medications Ordered in ED Medications  sodium chloride 0.9 % bolus 1,000 mL (has no administration in time range)    ED Course/ Medical Decision Making/ A&P                           Medical Decision Making Amount and/or Complexity of Data Reviewed Labs: ordered. Decision-making details documented in ED Course. Radiology: ordered and independent interpretation performed. Decision-making details documented in ED Course. ECG/medicine tests: ordered and independent interpretation performed. Decision-making details documented in ED Course.  Risk Prescription drug management.  1 day of cough, runny nose, congestion chills but no fever.  He is tachycardic on arrival but no hypoxia or increased work of breathing.  No chest pain or shortness of breath.  COVID test done in triage is negative but he has only been sick for 1 day.  Chest x-ray shows no infiltrate.  Results reviewed interpreted by me  Tachycardia is improving with IV fluids.  D-dimer is negative.  Chest x-ray shows no infiltrate.  Troponin negative.  Labs are reassuring does have some nonspecific leukocytosis somewhat worse than previous.  Creatinine is at baseline  HR improved to 90s. Given his beta blocker dose tonight.   Ambulatory  without desaturation.  X-ray negative for pneumonia and pneumothorax.  Will treat supportively for bronchitis.  COVID is still a possibility despite his negative test today.  Patient denies any chest pain or shortness of breath and heart rate has improved to the 90s.  Troponin negative x 2.  Low suspicion for ACS.  D-dimer is negative.  Low suspicion for pulmonary embolism.  Will give course of antibiotics and bronchodilators for bronchitis.  Keep self hydrated at home.  Heart rate has improved with IV fluids and patient feels improved without dizziness, chest pain or shortness of breath.  Discussed PCP follow-up.  Return precautions discussed.        Final Clinical Impression(s) / ED Diagnoses Final diagnoses:  Bronchitis    Rx / DC Orders ED Discharge Orders     None         Chaos Carlile, Annie Main, MD 11/24/21 2356

## 2022-04-04 ENCOUNTER — Other Ambulatory Visit: Payer: Self-pay

## 2022-04-04 ENCOUNTER — Emergency Department (HOSPITAL_BASED_OUTPATIENT_CLINIC_OR_DEPARTMENT_OTHER)
Admission: EM | Admit: 2022-04-04 | Discharge: 2022-04-04 | Disposition: A | Discharge status: Home / Self Care | Payer: Medicare Other | Attending: Emergency Medicine | Admitting: Emergency Medicine

## 2022-04-04 DIAGNOSIS — Z87891 Personal history of nicotine dependence: Secondary | ICD-10-CM | POA: Insufficient documentation

## 2022-04-04 DIAGNOSIS — Z91040 Latex allergy status: Secondary | ICD-10-CM | POA: Insufficient documentation

## 2022-04-04 DIAGNOSIS — Z79899 Other long term (current) drug therapy: Secondary | ICD-10-CM | POA: Insufficient documentation

## 2022-04-04 DIAGNOSIS — Z8551 Personal history of malignant neoplasm of bladder: Secondary | ICD-10-CM | POA: Insufficient documentation

## 2022-04-04 DIAGNOSIS — I10 Essential (primary) hypertension: Secondary | ICD-10-CM | POA: Insufficient documentation

## 2022-04-04 DIAGNOSIS — Z8616 Personal history of COVID-19: Secondary | ICD-10-CM | POA: Diagnosis not present

## 2022-04-04 DIAGNOSIS — R0981 Nasal congestion: Secondary | ICD-10-CM | POA: Insufficient documentation

## 2022-04-04 DIAGNOSIS — Z20822 Contact with and (suspected) exposure to covid-19: Secondary | ICD-10-CM | POA: Diagnosis not present

## 2022-04-04 LAB — RESP PANEL BY RT-PCR (RSV, FLU A&B, COVID)  RVPGX2
Influenza A by PCR: NEGATIVE
Influenza B by PCR: NEGATIVE
Resp Syncytial Virus by PCR: NEGATIVE
SARS Coronavirus 2 by RT PCR: NEGATIVE

## 2022-04-04 NOTE — ED Triage Notes (Signed)
Patient presents to ED via POV from home. Here with congestion. Denies other symptoms. Ambulatory.

## 2022-04-04 NOTE — ED Provider Notes (Signed)
[facility] EMERGENCY DEPARTMENT Provider Note  CSN: [id number] Arrival date & time: 04/04/22 5188  Chief Complaint(s) Nasal Congestion  HPI Jerry Hunt is a 81 y.o. male with a history of hypertension, hyperlipidemia presenting with nasal congestion.  He reports that nasal congestion started yesterday.  He came to the emergency department because he reports that a few years ago he became very sick with COVID and almost died.  He reports that his wife wants him to come to the emergency department for things like this now.  He denies any sore throat, runny nose, shortness of breath, fevers or chills, difficulty breathing, abdominal pain, nausea, vomiting, diarrhea, chest pain, any other symptoms.   Past Medical History Past Medical History:  Diagnosis Date   Bladder cancer ([place])    High cholesterol    Hypertension    MRSA (methicillin resistant Staphylococcus aureus)    There are no problems to display for this patient.  Home Medication(s) Prior to Admission medications   Medication Sig Start Date End Date Taking? Authorizing Provider  albuterol (VENTOLIN HFA) 108 (90 Base) MCG/ACT inhaler Inhale 1-2 puffs into the lungs every 6 (six) hours as needed for wheezing or shortness of breath. 11/24/21   Rancour, Annie Main, MD  amLODipine (NORVASC) 5 MG tablet Take by mouth. 07/03/18   [provider]  benzonatate (TESSALON) 100 MG capsule Take 1 capsule (100 mg total) by mouth every 8 (eight) hours. 11/24/21   Rancour, Annie Main, MD  Cholecalciferol 25 MCG (1000 UT) tablet Take by mouth.    [provider]  cholestyramine light (PREVALITE) 4 GM/DOSE powder Take 4 g by mouth 2 (two) times daily. 05/13/19   [provider]  doxepin (SINEQUAN) 10 MG capsule TAKE 1 CAPSULE(10 MG) BY MOUTH DAILY 12/02/16   [provider]  doxycycline (VIBRAMYCIN) 100 MG capsule Take 1 capsule (100 mg total) by mouth 2 (two) times daily. 11/24/21   Rancour, Annie Main, MD   HYDROcodone-acetaminophen (NORCO/VICODIN) 5-325 MG tablet Take 1-2 tablets by mouth every 6 (six) hours as needed. 09/15/16   Veryl Speak, MD  metoprolol succinate (TOPROL-XL) 50 MG 24 hr tablet Take 50 mg by mouth daily. 05/13/19   [provider]  Omega-3 Fatty Acids (FISH OIL) 1000 MG CAPS TAKE 2 CAPSULES BY MOUTH TWICE DAILY 12/02/16   [provider]  primidone (MYSOLINE) 50 MG tablet TAKE 1 TABLET(50 MG) BY MOUTH TWICE DAILY 12/02/16   [provider]  vitamin E 180 MG (400 UNITS) capsule Take by mouth.    [provider]                                                                                                                                    Past Surgical History Past Surgical History:  Procedure Laterality Date   BACK SURGERY     BLADDER SURGERY     Family History No family history on file.  Social History Social History   Tobacco Use   Smoking status: Former    Types: Cigarettes   Smokeless tobacco: Never  Substance Use Topics   Alcohol use: No   Drug use: Never   Allergies Ace inhibitors, Aspirin, Atorvastatin, Ezetimibe, Olmesartan medoxomil-hctz, Other, Telithromycin, Latex, and Tilactase  Review of Systems Review of Systems  All other systems reviewed and are negative.   Physical Exam Vital Signs  I have reviewed the triage vital signs BP (!) 148/79   Pulse 95   Temp (!) 97.5 F (36.4 C) (Oral)   Resp 17   Ht '5\' 9"'$  (1.753 m)   Wt 68 kg   SpO2 95%   BMI 22.15 kg/m  Physical Exam Vitals and nursing note reviewed.  Constitutional:      General: He is not in acute distress.    Appearance: Normal appearance.  HENT:     Nose: Congestion present.     Mouth/Throat:     Mouth: Mucous membranes are moist.  Eyes:     Conjunctiva/sclera: Conjunctivae normal.  Cardiovascular:     Rate and Rhythm: Normal rate and regular rhythm.  Pulmonary:     Effort: Pulmonary effort is normal. No respiratory distress.      Breath sounds: Normal breath sounds.  Abdominal:     General: Abdomen is flat.     Palpations: Abdomen is soft.     Tenderness: There is no abdominal tenderness.  Musculoskeletal:     Right lower leg: No edema.     Left lower leg: No edema.  Skin:    General: Skin is warm and dry.     Capillary Refill: Capillary refill takes less than 2 seconds.  Neurological:     Mental Status: He is alert and oriented to person, place, and time. Mental status is at baseline.  Psychiatric:        Mood and Affect: Mood normal.        Behavior: Behavior normal.     ED Results and Treatments Labs (all labs ordered are listed, but only abnormal results are displayed) Labs Reviewed  RESP PANEL BY RT-PCR (RSV, FLU A&B, COVID)  [id number]                                                                                                                          Radiology No results found.  Pertinent labs & imaging results that were available during my care of the patient were reviewed by me and considered in my medical decision making (see MDM for details).  Medications Ordered in ED Medications - No data to display  Procedures Procedures  (including critical care time)  Medical Decision Making / ED Course   MDM:  81 year old male presenting to the emergency department for congestion.  Patient very well-appearing, does have congestion on exam.  Suspect viral upper respiratory tract infection.  COVID and flu swab obtained to evaluate for antiviral therapy but negative for COVID, flu, RSV.  No facial tenderness or pain to suggest sinus infection and symptoms only present for a couple days.  Advised supportive care for his symptoms. Will discharge patient to home. All questions answered. Patient comfortable with plan of discharge. Return precautions discussed with patient and  specified on the after visit summary.      Lab Tests: -I ordered, reviewed, and interpreted labs.   The pertinent results include:   Labs Reviewed  RESP PANEL BY RT-PCR (RSV, FLU A&B, COVID)  [id number]     Medicines ordered and prescription drug management: No orders of the defined types were placed in this encounter.   -I have reviewed the patients home medicines and have made adjustments as needed  Co morbidities that complicate the patient evaluation  Past Medical History:  Diagnosis Date   Bladder cancer ([place])    High cholesterol    Hypertension    MRSA (methicillin resistant Staphylococcus aureus)       Dispostion: Disposition decision including need for hospitalization was considered, and patient discharged from emergency department.    Final Clinical Impression(s) / ED Diagnoses Final diagnoses:  Nasal congestion     This chart was dictated using voice recognition software.  Despite best efforts to proofread,  errors can occur which can change the documentation meaning.    Cristie Hem, MD 04/04/22 1106

## 2022-04-04 NOTE — Discharge Instructions (Signed)
We evaluated you for your nasal congestion.  Your COVID/flu swab was negative.  Your symptoms are likely due to a different viral upper respiratory tract infection.  Please continue to take Tylenol as needed for your symptoms.  You can take up to 1000 mg every 6 hours as needed.  Your symptoms should improve on their own.  Please return to the emergency department if you develop any severe headaches, nausea or vomiting, persistent fevers, confusion, or any other concerning symptoms.

## 2022-10-27 ENCOUNTER — Encounter (HOSPITAL_BASED_OUTPATIENT_CLINIC_OR_DEPARTMENT_OTHER): Payer: Self-pay | Admitting: Emergency Medicine

## 2022-10-27 ENCOUNTER — Other Ambulatory Visit: Payer: Self-pay

## 2022-10-27 ENCOUNTER — Emergency Department (HOSPITAL_BASED_OUTPATIENT_CLINIC_OR_DEPARTMENT_OTHER)
Admission: EM | Admit: 2022-10-27 | Discharge: 2022-10-27 | Disposition: A | Discharge status: Home / Self Care | Payer: Medicare Other | Attending: Emergency Medicine | Admitting: Emergency Medicine

## 2022-10-27 DIAGNOSIS — J029 Acute pharyngitis, unspecified: Secondary | ICD-10-CM

## 2022-10-27 DIAGNOSIS — Z79899 Other long term (current) drug therapy: Secondary | ICD-10-CM | POA: Insufficient documentation

## 2022-10-27 DIAGNOSIS — N183 Chronic kidney disease, stage 3 unspecified: Secondary | ICD-10-CM | POA: Diagnosis not present

## 2022-10-27 DIAGNOSIS — Z8546 Personal history of malignant neoplasm of prostate: Secondary | ICD-10-CM | POA: Insufficient documentation

## 2022-10-27 DIAGNOSIS — U071 COVID-19: Secondary | ICD-10-CM | POA: Insufficient documentation

## 2022-10-27 DIAGNOSIS — F0390 Unspecified dementia without behavioral disturbance: Secondary | ICD-10-CM | POA: Insufficient documentation

## 2022-10-27 DIAGNOSIS — I129 Hypertensive chronic kidney disease with stage 1 through stage 4 chronic kidney disease, or unspecified chronic kidney disease: Secondary | ICD-10-CM | POA: Diagnosis not present

## 2022-10-27 DIAGNOSIS — R059 Cough, unspecified: Secondary | ICD-10-CM | POA: Diagnosis present

## 2022-10-27 DIAGNOSIS — M791 Myalgia, unspecified site: Secondary | ICD-10-CM

## 2022-10-27 LAB — SARS CORONAVIRUS 2 BY RT PCR: SARS Coronavirus 2 by RT PCR: POSITIVE — AB

## 2022-10-27 MED ORDER — PAXLOVID (150/100) 10 X 150 MG & 10 X 100MG PO TBPK: 2.0000 | ORAL_TABLET | Freq: Two times a day (BID) | ORAL | 0 refills | Status: AC

## 2022-10-27 NOTE — Discharge Instructions (Signed)
As discussed, you tested positive for COVID which is most likely causing your symptoms.  Will recommend use of Tylenol at home for pain/potential fever given your history of CKD, will recommend staying away from medicines like Advil, Aleve or other NSAIDs.  Will send in Paxlovid to take for treatment of your current COVID infection.  Recommend follow-up with primary care within 2 to 3 days for reassessment of your symptoms, if you develop fever, chest pain, shortness of breath, cough, or other abnormality that we discussed, please do not hesitate to return to emergency department for further evaluation/assessment.

## 2022-10-27 NOTE — ED Provider Notes (Signed)
[facility] Provider Note   CSN: [id number] Arrival date & time: 10/27/22  8416     History  Chief Complaint  Patient presents with   Cough    Jerry Hunt is a 82 y.o. male.   Cough   82 year old male presents emergency department with complaints of chills, sore throat, nasal congestion, body aches.  Patient states his symptoms began last night.  States that he works in a [employer] with multiple potential sick exposures but nothing known to him.  Denies any cough, fever, shortness of breath, chest pain, abdominal pain, nausea, vomiting, urinary symptoms, change in bowel habits.  States that his wife told him to come the emergency department to check to see if he had COVID.  States that the day, only has chills but without headache or other symptoms.  When headache was appreciated, noticed in frontal region.  Reports gradual onset and very minimally worsened.  Denies any visual symptoms, gait abnormality from baseline, weakness/sensory deficit of lower extremities, slurred speech, facial droop.  States that he took no medication for symptoms and headache went away" last night.  Past medical history significant for bladder cancer, hypercholesterolemia, hypertension, MRSA, CKD 3, vitamin D deficiency, metabolic bone disease, essential tremor, senile dementia  Home Medications Prior to Admission medications   Medication Sig Start Date End Date Taking? Authorizing Provider  nirmatrelvir & ritonavir (PAXLOVID, 150/100,) 10 x 150 MG & 10 x 100MG  TBPK Take 2 tablets by mouth 2 (two) times daily for 5 days. 10/27/22 11/01/22 Yes Sherian Maroon A, PA  albuterol (VENTOLIN HFA) 108 (90 Base) MCG/ACT inhaler Inhale 1-2 puffs into the lungs every 6 (six) hours as needed for wheezing or shortness of breath. 11/24/21   Rancour, Jeannett Senior, MD  amLODipine (NORVASC) 5 MG tablet Take by mouth. 07/03/18   [provider]  benzonatate (TESSALON) 100 MG  capsule Take 1 capsule (100 mg total) by mouth every 8 (eight) hours. 11/24/21   Rancour, Jeannett Senior, MD  Cholecalciferol 25 MCG (1000 UT) tablet Take by mouth.    [provider]  cholestyramine light (PREVALITE) 4 GM/DOSE powder Take 4 g by mouth 2 (two) times daily. 05/13/19   [provider]  doxepin (SINEQUAN) 10 MG capsule TAKE 1 CAPSULE(10 MG) BY MOUTH DAILY 12/02/16   [provider]  doxycycline (VIBRAMYCIN) 100 MG capsule Take 1 capsule (100 mg total) by mouth 2 (two) times daily. 11/24/21   Rancour, Jeannett Senior, MD  HYDROcodone-acetaminophen (NORCO/VICODIN) 5-325 MG tablet Take 1-2 tablets by mouth every 6 (six) hours as needed. 09/15/16   Geoffery Lyons, MD  metoprolol succinate (TOPROL-XL) 50 MG 24 hr tablet Take 50 mg by mouth daily. 05/13/19   [provider]  Omega-3 Fatty Acids (FISH OIL) 1000 MG CAPS TAKE 2 CAPSULES BY MOUTH TWICE DAILY 12/02/16   [provider]  primidone (MYSOLINE) 50 MG tablet TAKE 1 TABLET(50 MG) BY MOUTH TWICE DAILY 12/02/16   [provider]  vitamin E 180 MG (400 UNITS) capsule Take by mouth.    [provider]      Allergies    Ace inhibitors, Aspirin, Atorvastatin, Ezetimibe, Olmesartan medoxomil-hctz, Other, Telithromycin, Latex, and Tilactase    Review of Systems   Review of Systems  Respiratory:  Positive for cough.   All other systems reviewed and are negative.   Physical Exam Updated Vital Signs BP (!) 142/93 (BP Location: Left Arm)   Pulse (!) 112   Temp 98.1 F (  36.7 C) (Oral)   Resp 18   Ht 5\' 9"  (1.753 m)   Wt 65.8 kg   SpO2 95%   BMI 21.41 kg/m  Physical Exam Vitals and nursing note reviewed.  Constitutional:      General: He is not in acute distress.    Appearance: He is well-developed.  HENT:     Head: Normocephalic and atraumatic.     Right Ear: Tympanic membrane, ear canal and external ear normal.     Left Ear: Tympanic membrane, ear canal and external ear normal.      Nose: Congestion and rhinorrhea present.     Mouth/Throat:     Comments: Mild to moderate posterior pharyngeal erythema.  Uvula midline rise symmetric with phonation.  Tonsils are 1+ bilaterally with no obvious exudate.  No sublingual or submandibular swelling.  No trismus.  No change in phonation per patient. Eyes:     Conjunctiva/sclera: Conjunctivae normal.  Cardiovascular:     Rate and Rhythm: Normal rate and regular rhythm.  Pulmonary:     Effort: Pulmonary effort is normal. No respiratory distress.     Breath sounds: Normal breath sounds. No wheezing, rhonchi or rales.  Abdominal:     Palpations: Abdomen is soft.     Tenderness: There is no abdominal tenderness. There is no right CVA tenderness, left CVA tenderness or guarding.  Musculoskeletal:        General: No swelling.     Cervical back: Neck supple.     Right lower leg: No edema.     Left lower leg: No edema.  Skin:    General: Skin is warm and dry.     Capillary Refill: Capillary refill takes less than 2 seconds.  Neurological:     Mental Status: He is alert.     Comments: Alert and oriented to self, place, time and event.   Speech is fluent, clear without dysarthria or dysphasia.   Patient moves all 4 extremities without difficulty sensation intact in upper/lower extremities   Patient able to ambulate independently without obvious gait deviation. CN I not tested  CN II not tested CN III, IV, VI PERRLA and EOMs intact bilaterally  CN V Intact sensation to sharp and light touch to the face  CN VII facial movements symmetric  CN VIII not tested  CN IX, X no uvula deviation, symmetric rise of soft palate  CN XI symmetric SCM and trapezius strength bilaterally  CN XII Midline tongue protrusion, symmetric L/R movements     Psychiatric:        Mood and Affect: Mood normal.     ED Results / Procedures / Treatments   Labs (all labs ordered are listed, but only abnormal results are displayed) Labs Reviewed  SARS  CORONAVIRUS 2 BY RT PCR - Abnormal; Notable for the following components:      Result Value   SARS Coronavirus 2 by RT PCR POSITIVE (*)    All other components within normal limits    EKG None  Radiology No results found.  Procedures Procedures    Medications Ordered in ED Medications - No data to display  ED Course/ Medical Decision Making/ A&P                             Medical Decision Making  This patient presents to the ED for concern of headache, sore throat, chills, this involves an extensive number of treatment options, and is  a complaint that carries with it a high risk of complications and morbidity.  The differential diagnosis includes COVID, influenza, RSV, meningitis, encephalitis, pneumonia, sepsis   Co morbidities that complicate the patient evaluation  See HPI   Additional history obtained:  Additional history obtained from EMR External records from outside source obtained and reviewed including hospital records   Lab Tests:  I Ordered, and personally interpreted labs.  The pertinent results include: COVID-positive   Imaging Studies ordered:  N/a   Cardiac Monitoring: / EKG:  The patient was maintained on a cardiac monitor.  I personally viewed and interpreted the cardiac monitored which showed an underlying rhythm of: Sinus rhythm   Consultations Obtained:  N/a   Problem List / ED Course / Critical interventions / Medication management  COVID Reevaluation of the patient showed that the patient stayed the same I have reviewed the patients home medicines and have made adjustments as needed   Social Determinants of Health:  Former cigarette use.  Denies illicit drug use.   Test / Admission - Considered:  COVID Vitals signs significant for mild hypertension with blood pressure 140/93. Otherwise within normal range and stable throughout visit. Laboratory studies significant for: See above 82 year old male presents emergency  department with complaints of headache, chills, body aches, sore throat that began yesterday with only chills present today.  On exam, patient overall well-appearing in no acute respiratory distress, without fever.  Patient without cough with no obvious wheeze, rales, rhonchi on exam; low suspicion for pneumonia, infiltrate or other acute cardiopulmonary abnormality.  Low clinical suspicion for Ludwig angina, peritonsillar abscess, angioedema, Lemierre's disease.  Sore throat most likely secondary to viral process.  Patient was with headache yesterday that was slow in onset and gradually worsened but abated without medical therapy.  Low suspicion for CVA or other acute intracranial process given lack of neurologic deficit on exam and spontaneous resolution of headache.  Patient did test positive for COVID which is most likely causing his symptoms.  Discussion was had with patient regarding beginning of antivirals which she elected for.  Further workup deemed unnecessary at this time given overall well-appearing nature of patient, stable vital signs.  Close follow-up with primary care recommended within 2 to 3 days for reevaluation of symptoms.  Treatment plan discussed at length with patient and he acknowledged understanding was agreeable to said plan.  Patient overall well-appearing, afebrile in no acute distress. Worrisome signs and symptoms were discussed with the patient, and the patient acknowledged understanding to return to the ED if noticed. Patient was stable upon discharge. '        Final Clinical Impression(s) / ED Diagnoses Final diagnoses:  COVID  Sore throat  Myalgia    Rx / DC Orders ED Discharge Orders          Ordered    nirmatrelvir & ritonavir (PAXLOVID, 150/100,) 10 x 150 MG & 10 x 100MG  TBPK  2 times daily        10/27/22 1041              Peter Garter, [place] 10/27/22 1049    Tanda Rockers A, DO 10/27/22 1243

## 2022-10-27 NOTE — ED Notes (Signed)
Discharge instructions reviewed with patient. Patient verbalizes understanding, no further questions at this time. Medications/prescriptions and follow up information provided. No acute distress noted at time of departure.  

## 2022-10-27 NOTE — ED Triage Notes (Signed)
States chills, cough, h/a since last night

## 2022-10-29 IMAGING — DX DG CHEST 1V PORT
1 series · 1 of 1 positions shown · non-contrast
Comparison: 06/04/2019

CLINICAL DATA: Cough and chills

EXAM:
PORTABLE CHEST 1 VIEW

[chest ap]
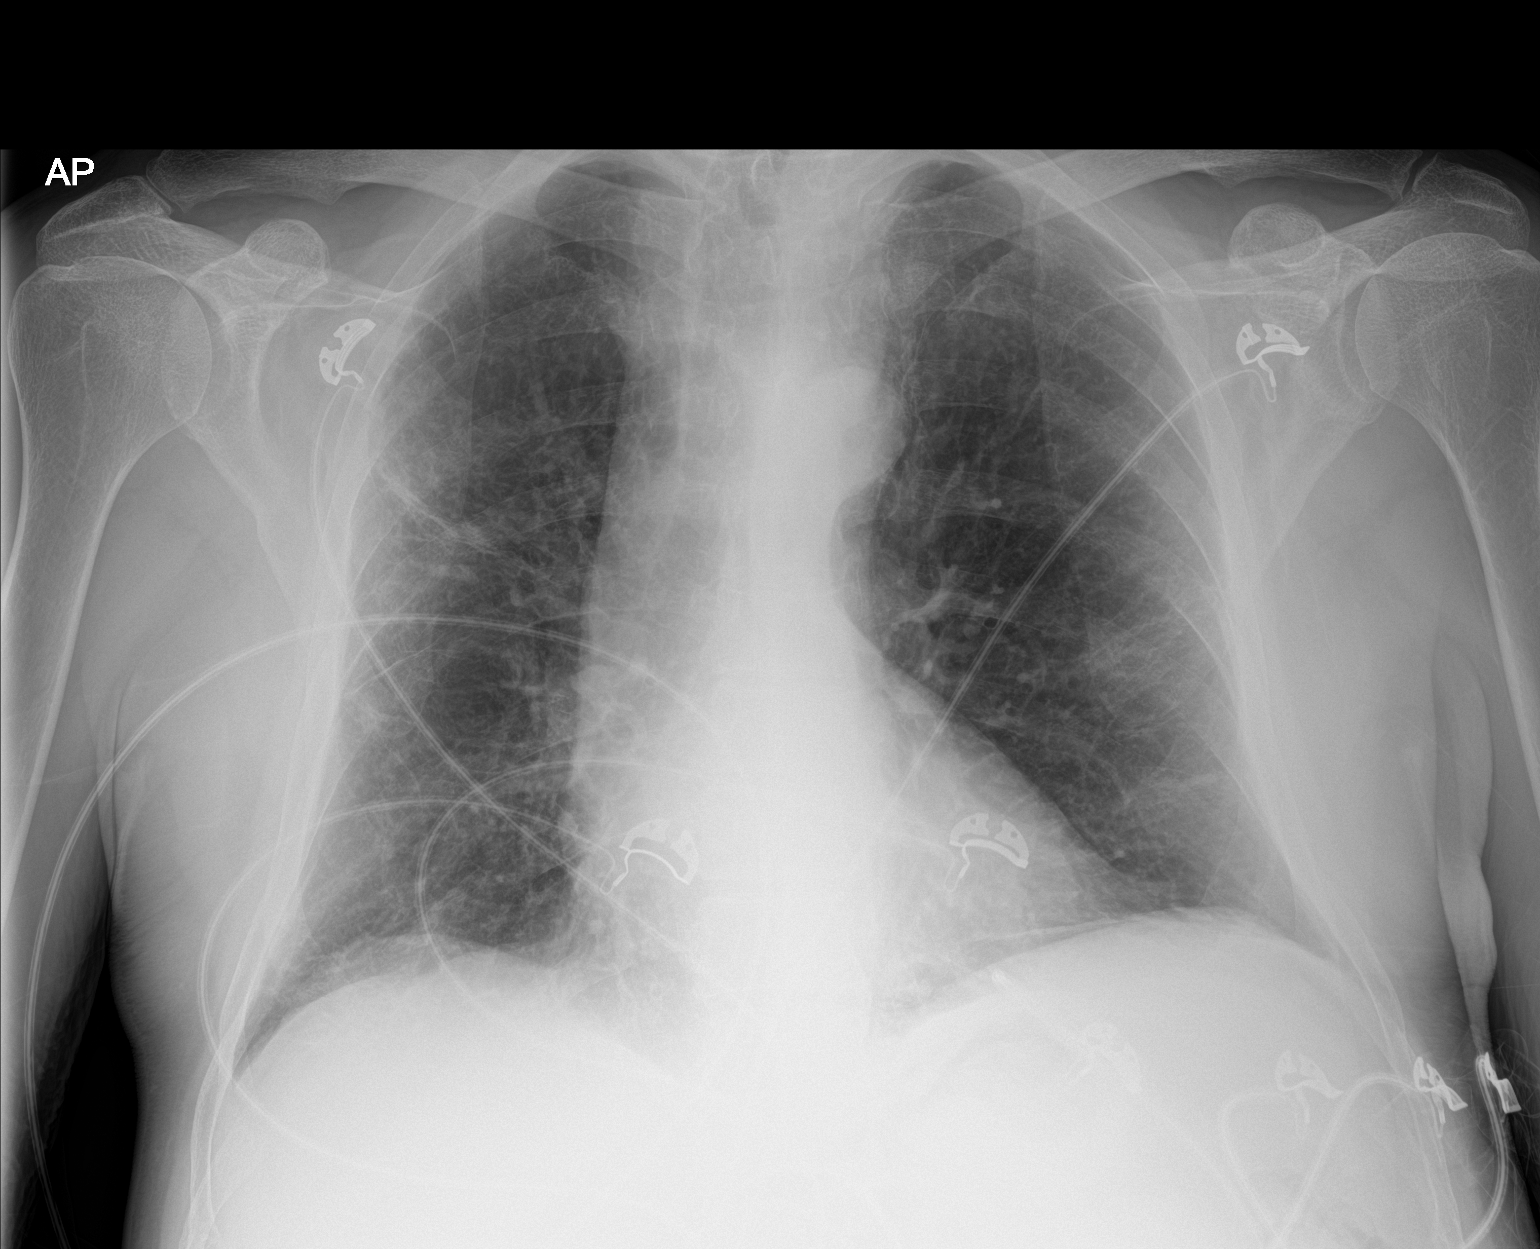

[1 of 1 positions shown; findings below may reference images not displayed]

FINDINGS: There are areas of scarring involving both lung fields. No large
focal infiltrate or pleural effusion. The heart size is stable. No
pneumothorax. There is a nodular density projecting over the
peripheral right mid lung zone.
IMPRESSION: 1. No definite acute cardiopulmonary process.
2. Scattered areas of scarring are noted bilaterally.
3. Nodular density in the peripheral right mid lung zone. Outpatient
follow-up CT of the chest is recommended.
